# Patient Record
Sex: Female | Born: 1965 | Hispanic: No | Marital: Married | Smoking: Never smoker
Health system: Southern US, Community
[De-identification: ages and names within clinical notes are randomized; demographics above are authoritative.]

## PROBLEM LIST (undated history)

## (undated) HISTORY — PX: OTHER SURGICAL HISTORY: SHX169

---

## 2017-06-13 ENCOUNTER — Emergency Department (HOSPITAL_COMMUNITY)
Admission: EM | Admit: 2017-06-13 | Discharge: 2017-06-13 | Disposition: A | Discharge status: Home / Self Care | Payer: Self-pay | Attending: Emergency Medicine | Admitting: Emergency Medicine

## 2017-06-13 ENCOUNTER — Encounter (HOSPITAL_COMMUNITY): Payer: Self-pay | Admitting: Emergency Medicine

## 2017-06-13 ENCOUNTER — Emergency Department (HOSPITAL_COMMUNITY): Payer: Self-pay

## 2017-06-13 DIAGNOSIS — H5713 Ocular pain, bilateral: Secondary | ICD-10-CM | POA: Insufficient documentation

## 2017-06-13 DIAGNOSIS — Z79899 Other long term (current) drug therapy: Secondary | ICD-10-CM | POA: Insufficient documentation

## 2017-06-13 DIAGNOSIS — R51 Headache: Secondary | ICD-10-CM | POA: Insufficient documentation

## 2017-06-13 DIAGNOSIS — H539 Unspecified visual disturbance: Secondary | ICD-10-CM | POA: Insufficient documentation

## 2017-06-13 LAB — COMPREHENSIVE METABOLIC PANEL
ALT: 25 U/L (ref 14–54)
ANION GAP: 9 (ref 5–15)
AST: 23 U/L (ref 15–41)
Albumin: 4.3 g/dL (ref 3.5–5.0)
Alkaline Phosphatase: 76 U/L (ref 38–126)
BILIRUBIN TOTAL: 0.8 mg/dL (ref 0.3–1.2)
BUN: 11 mg/dL (ref 6–20)
CO2: 26 mmol/L (ref 22–32)
Calcium: 9 mg/dL (ref 8.9–10.3)
Chloride: 104 mmol/L (ref 101–111)
Creatinine, Ser: 0.83 mg/dL (ref 0.44–1.00)
Glucose, Bld: 81 mg/dL (ref 65–99)
POTASSIUM: 3.7 mmol/L (ref 3.5–5.1)
Sodium: 139 mmol/L (ref 135–145)
TOTAL PROTEIN: 7.5 g/dL (ref 6.5–8.1)

## 2017-06-13 LAB — CBC WITH DIFFERENTIAL/PLATELET
BASOS ABS: 0 10*3/uL (ref 0.0–0.1)
Basophils Relative: 0 %
EOS PCT: 0 %
Eosinophils Absolute: 0 10*3/uL (ref 0.0–0.7)
HCT: 40.4 % (ref 36.0–46.0)
Hemoglobin: 13.3 g/dL (ref 12.0–15.0)
LYMPHS PCT: 36 %
Lymphs Abs: 2.9 10*3/uL (ref 0.7–4.0)
MCH: 30.4 pg (ref 26.0–34.0)
MCHC: 32.9 g/dL (ref 30.0–36.0)
MCV: 92.4 fL (ref 78.0–100.0)
Monocytes Absolute: 0.6 10*3/uL (ref 0.1–1.0)
Monocytes Relative: 7 %
NEUTROS ABS: 4.5 10*3/uL (ref 1.7–7.7)
Neutrophils Relative %: 57 %
PLATELETS: 218 10*3/uL (ref 150–400)
RBC: 4.37 MIL/uL (ref 3.87–5.11)
RDW: 13.1 % (ref 11.5–15.5)
WBC: 8 10*3/uL (ref 4.0–10.5)

## 2017-06-13 MED ORDER — TETRACAINE HCL 0.5 % OP SOLN
2.0000 [drp] | Freq: Once | OPHTHALMIC | Status: AC
  Administered 2017-06-13: 2 [drp] via OPHTHALMIC
  Filled 2017-06-13: qty 4

## 2017-06-13 MED ORDER — GADOBENATE DIMEGLUMINE 529 MG/ML IV SOLN
20.0000 mL | Freq: Once | INTRAVENOUS | Status: AC | PRN
  Administered 2017-06-13: 20 mL via INTRAVENOUS

## 2017-06-13 NOTE — ED Provider Notes (Signed)
[facility]-EMERGENCY DEPT Provider Note   CSN: [id number] Arrival date & time: 06/13/17  1214     History   Chief Complaint Chief Complaint  Patient presents with  . needs CT scan    HPI Stepheny Mcgroarty is a 51 y.o. female.  HPI   Marlowe SaxFelichia Kopinski is a 51 y.o. female, patient with no pertinent past medical history, presenting to the ED with intermittent eye pain for the past two weeks.  Progression of symptoms: Began to have a headache on right right frontal region accompanied by nausea about two weeks ago. After 2 days she began to have a foreign body sensation in the right eye with a visual disturbance, "like I could see something floating in my vision."  Seen by Herschel SenegalZachary Nobles, OD on 05/28/17. He reportedly found no FB. Was prescribed 7 days of Tobramycin/Dexamethasone (0.3-0.1%, 1 drop QID in the right eye).   Two days ago began to have severe bilateral eye pain, puffiness around her eyes, with teary drainage and severe photophobia. Eyes and surrounding area were sensitive to touch. Pain with EOMs, no pain without eye movement. Saw Dr. Mack HookAndrew Mincey that same day (9/11).  Information from visit summary from Dr. 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 Dr. Genia DelMincey prescribed prednisolone 1% (1 drop in both eyes four times a day) and told her to go to the ED for MRI brain and orbits c/s contrast. She has not started the prednisolone. She has follow up appt on Friday, Sept 14 at 8:30 AM. States earlier today she felt some pressure around her eyes bilaterally as well as some "fogginess" to her vision. No complaints currently. She has not worn her contacts since her complaints began.  Denies dizziness, nausea/vomiting, eye discharge, or any other complaints.   History reviewed. No pertinent past medical history.  There are no active problems to display for this patient.   Past Surgical History:  Procedure Laterality Date  . cyst removed     off  back     OB History    No data available       Home Medications    Prior to Admission medications   Medication Sig Start Date End Date Taking? Authorizing Provider  Multiple Vitamin (MULTIVITAMIN WITH MINERALS) TABS tablet Take 1 tablet by mouth once a week.   Yes [provider]  prednisoLONE acetate (PRED FORTE) 1 % ophthalmic suspension Place 1 drop into both eyes 4 (four) times daily. 06/11/17   [provider]    Family History No family history on file.  Social History Social History  Substance Use Topics  . Smoking status: Never Smoker  . Smokeless tobacco: Never Used  . Alcohol use Not on file     Allergies   Patient has no known allergies.   Review of Systems Review of Systems  Constitutional: Negative for chills, diaphoresis and fever.  Eyes: Positive for pain and visual disturbance.  Respiratory: Negative for shortness of breath.   Cardiovascular: Negative for chest pain.  Gastrointestinal: Negative for nausea and vomiting.  Musculoskeletal: Negative for back pain and neck pain.  Neurological: Negative for dizziness, syncope, weakness, light-headedness, numbness and headaches.  All other systems reviewed and are negative.    Physical Exam Updated Vital Signs BP 106/76   Pulse 82   Temp 98.7 F (37.1 C) (Oral)   Resp 16   Ht 5\' 4"  (1.626 m)   Wt 115.5 kg (254 lb 9 oz)  SpO2 100%   BMI 43.70 kg/m   Physical Exam  Constitutional: She appears well-developed and well-nourished. No distress.  HENT:  Head: Normocephalic and atraumatic.  Eyes: Pupils are equal, round, and reactive to light. Conjunctivae and EOM are normal.  No pain with EOMs. No scleral or conjunctival injection. No noted discharge. No contact lenses in place. No periorbital edema.  Tono-Pen values: Right eye: 18  Left eye: 17    Visual Acuity  Right Eye Distance: 20/60 Left Eye Distance: 20/40 Bilateral Distance: 20/40  Right Eye Near:   Left Eye Near:     Bilateral Near:  20/25 (with glasses)   Neck: Neck supple.  Cardiovascular: Normal rate, regular rhythm and intact distal pulses.   Pulmonary/Chest: Effort normal. No respiratory distress.  Abdominal: There is no guarding.  Musculoskeletal: She exhibits no edema.  Lymphadenopathy:    She has no cervical adenopathy.  Neurological: She is alert.  Skin: Skin is warm and dry. She is not diaphoretic.  Psychiatric: She has a normal mood and affect. Her behavior is normal.  Nursing note and vitals reviewed.    ED Treatments / Results  Labs (all labs ordered are listed, but only abnormal results are displayed) Labs Reviewed  COMPREHENSIVE METABOLIC PANEL  CBC WITH DIFFERENTIAL/PLATELET    EKG  EKG Interpretation None       Radiology Mr Laqueta Jean And Wo Contrast  Result Date: 06/13/2017 CLINICAL DATA:  Initial evaluation for acute visual loss EXAM: MRI HEAD AND ORBITS WITHOUT AND WITH CONTRAST TECHNIQUE: Multiplanar, multiecho pulse sequences of the brain and surrounding structures were obtained without and with intravenous contrast. Multiplanar, multiecho pulse sequences of the orbits and surrounding structures were obtained including fat saturation techniques, before and after intravenous contrast administration. CONTRAST:  20mL MULTIHANCE GADOBENATE DIMEGLUMINE 529 MG/ML IV SOLN COMPARISON:  None. FINDINGS: MRI HEAD FINDINGS Brain: Cerebral volume within normal limits. No focal parenchymal signal abnormality. No evidence for acute or subacute infarct. No evidence for chronic infarction. No acute or chronic intracranial hemorrhage. No mass lesion, midline shift or mass effect. No hydrocephalus. No extra-axial fluid collection. Major dural sinuses are patent. Pituitary gland suprasellar region within normal limits. Vascular: Major intracranial vascular flow voids maintained. Skull and upper cervical spine: Craniocervical junction normal. Upper cervical spine within normal limits. Bone  marrow signal intensity normal. No scalp soft tissue abnormality. Other: Inner ear structures normal.  No mastoid effusion. MRI ORBITS FINDINGS Orbits: Globes normal in appearance with symmetric size and morphology bilaterally. Optic nerves symmetric and normal in appearance bilaterally without abnormal enhancement or other abnormality. No appreciable bulging of the optic discs. No abnormality seen at the orbital apices. Optic chiasm normally position within the suprasellar cistern and is normal in appearance. Intraconal and extraconal fat well-preserved. Superior orbital veins normal. Extra-ocular muscles symmetric and within normal limits bilaterally. Lacrimal glands normal. No appreciable periorbital soft tissue swelling. Visualized sinuses: Mild scattered mucosal thickening within the anterior ethmoidal air cells. Otherwise clear. Soft tissues: Unremarkable. IMPRESSION: Normal MRI of the brain and orbits. No acute abnormality identified. Electronically Signed   By: Rise Mu M.D.   On: 06/13/2017 20:08   Mr Rockwell [place] [employer] Contrast  Result Date: 06/13/2017 CLINICAL DATA:  Initial evaluation for acute visual loss EXAM: MRI HEAD AND ORBITS WITHOUT AND WITH CONTRAST TECHNIQUE: Multiplanar, multiecho pulse sequences of the brain and surrounding structures were obtained without and with intravenous contrast. Multiplanar, multiecho pulse sequences of the orbits and surrounding structures were obtained including fat  saturation techniques, before and after intravenous contrast administration. CONTRAST:  20mL MULTIHANCE GADOBENATE DIMEGLUMINE 529 MG/ML IV SOLN COMPARISON:  None. FINDINGS: MRI HEAD FINDINGS Brain: Cerebral volume within normal limits. No focal parenchymal signal abnormality. No evidence for acute or subacute infarct. No evidence for chronic infarction. No acute or chronic intracranial hemorrhage. No mass lesion, midline shift or mass effect. No hydrocephalus. No extra-axial fluid collection.  Major dural sinuses are patent. Pituitary gland suprasellar region within normal limits. Vascular: Major intracranial vascular flow voids maintained. Skull and upper cervical spine: Craniocervical junction normal. Upper cervical spine within normal limits. Bone marrow signal intensity normal. No scalp soft tissue abnormality. Other: Inner ear structures normal.  No mastoid effusion. MRI ORBITS FINDINGS Orbits: Globes normal in appearance with symmetric size and morphology bilaterally. Optic nerves symmetric and normal in appearance bilaterally without abnormal enhancement or other abnormality. No appreciable bulging of the optic discs. No abnormality seen at the orbital apices. Optic chiasm normally position within the suprasellar cistern and is normal in appearance. Intraconal and extraconal fat well-preserved. Superior orbital veins normal. Extra-ocular muscles symmetric and within normal limits bilaterally. Lacrimal glands normal. No appreciable periorbital soft tissue swelling. Visualized sinuses: Mild scattered mucosal thickening within the anterior ethmoidal air cells. Otherwise clear. Soft tissues: Unremarkable. IMPRESSION: Normal MRI of the brain and orbits. No acute abnormality identified. Electronically Signed   By: Rise Mu M.D.   On: 06/13/2017 20:08    Procedures Procedures (including critical care time)  Medications Ordered in ED Medications  tetracaine (PONTOCAINE) 0.5 % ophthalmic solution 2 drop (2 drops Both Eyes Given by Other 06/13/17 1743)  gadobenate dimeglumine (MULTIHANCE) injection 20 mL (20 mLs Intravenous Contrast Given 06/13/17 1913)     Initial Impression / Assessment and Plan / ED Course  I have reviewed the triage vital signs and the nursing notes.  Pertinent labs & imaging results that were available during my care of the patient were reviewed by me and considered in my medical decision making (see chart for details).     Patient presents with previous  bilateral eye pain. Ophthalmologist noted bilateral optic disc edema. MRI free from acute abnormalities. Patient pain-free at discharge. Prednisolone drops administered prior to discharge. Patient has close follow-up with ophthalmology tomorrow morning. Return precautions discussed.  Final Clinical Impressions(s) / ED Diagnoses   Final diagnoses:  Pain of both eyes    New Prescriptions Discharge Medication List as of 06/13/2017  8:54 PM       Anselm Pancoast, PA-C 06/13/17 2126    Linwood Dibbles, MD 06/15/17 [phone]

## 2017-06-13 NOTE — ED Notes (Signed)
Patient transported to MRI 

## 2017-06-13 NOTE — Discharge Instructions (Signed)
There were no acute abnormalities noted on the MRI. Please take the eyedrops, as prescribed by Dr. Genia DelMincey. Follow-up with Dr. Genia DelMincey, as planned, tomorrow morning. Return to the ED as needed for worsening symptoms.

## 2017-06-13 NOTE — ED Triage Notes (Signed)
Patient c/o left groin pain that has been intermittent over past year that is worse with movement.  Patient denies any issues with urination.   Patient c/o anterior headache with blood shot red eyes and clear drainage that started about week ago. patient was seen at eye doctor for it that saw nothing in eyes and took drops that was given for couple days.  patient thought ws having reaction to eye drops so went and saw another eye specialist, who was concerned for swollen nerves in posterior eyes and recommended patient get a ct scan.  Patient reports that she started having flu like symptoms: body aches, nausea, fatigue and headache that started 2 weeks ago that only lasted 2 days then came back yesterday but then went away.  When asking patient what she is wanting ED to be able to do for her today: she states that she wants to have CT scan to figure out what is causing nerves to be swollen and so she can follow back up with eye doctor.

## 2018-09-21 IMAGING — MR MR ORBITS WO/W CM
11 of 18 series · 31 of 48 positions shown · IV contrast (multihance)
Comparison: None.

CLINICAL DATA: Initial evaluation for acute visual loss

EXAM:
MRI HEAD AND ORBITS WITHOUT AND WITH CONTRAST
TECHNIQUE: Multiplanar, multiecho pulse sequences of the brain and surrounding
structures were obtained without and with intravenous contrast.
Multiplanar, multiecho pulse sequences of the orbits and surrounding
structures were obtained including fat saturation techniques, before
and after intravenous contrast administration.
CONTRAST:  20mL MULTIHANCE GADOBENATE DIMEGLUMINE 529 MG/ML IV SOLN

[Series 3: DWI · axial · 3.0mm · 1.09mm/px · z∈[-89,+48]mm · 8 of 96 slices shown (1 of 4)]
[im 1/96]
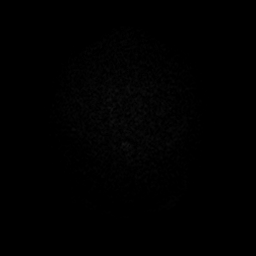
[im 14/96]
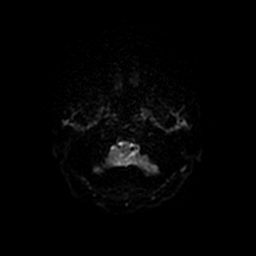
[im 28/96]
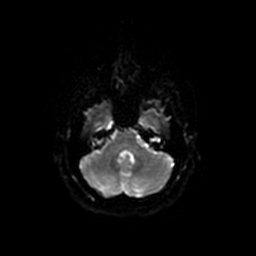
[im 41/96]
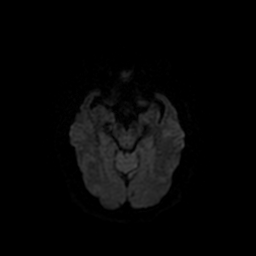
[im 55/96]
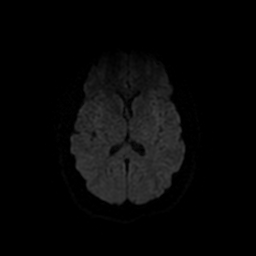
[im 68/96]
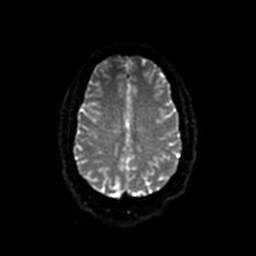
[im 82/96]
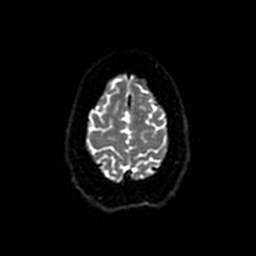
[im 96/96]
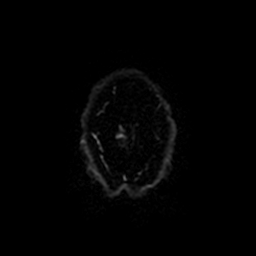

[Series 5: DWI · coronal · 5.0mm · 1.09mm/px · 5 of 62 slices shown (2 of 4)]
[im 1/62]
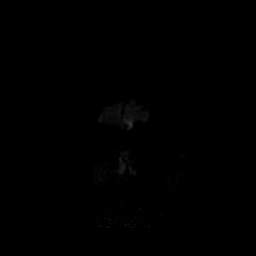
[im 16/62]
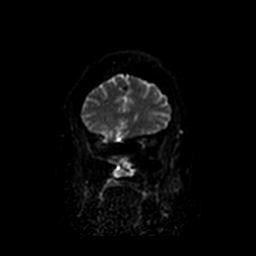
[im 31/62]
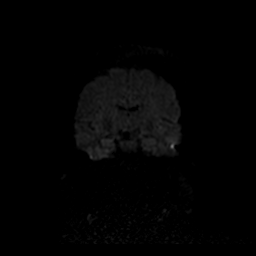
[im 46/62]
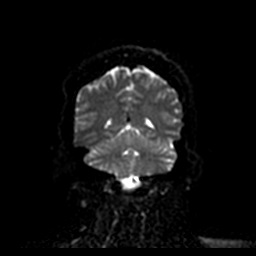
[im 62/62]
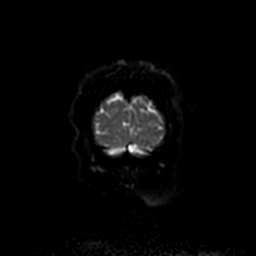

[Series 6: T2 · axial · 5.0mm · 0.43mm/px · z∈[-101,+54]mm · 2 of 24 slices shown]
[im 1/24]
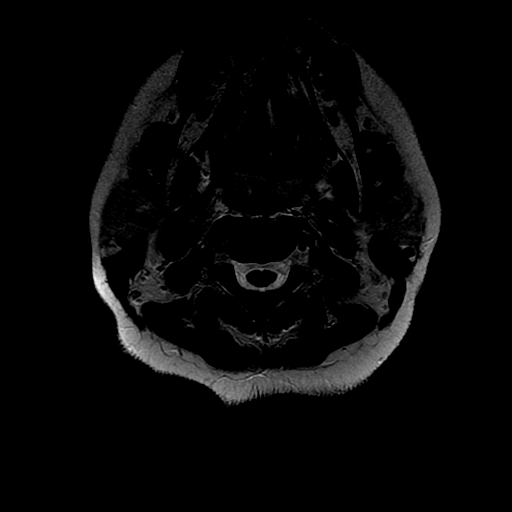
[im 24/24]
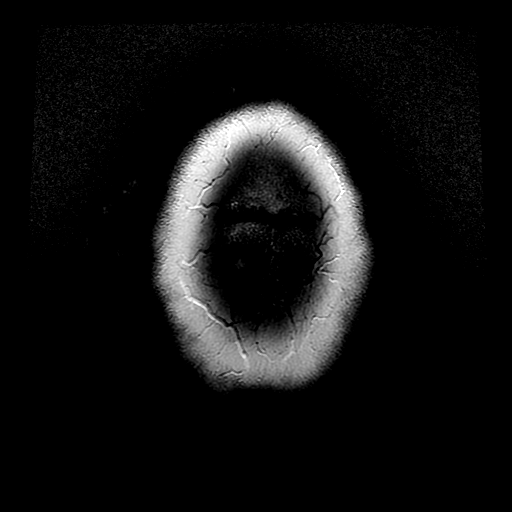

[Series 7: FLAIR · axial · 3.0mm · 0.43mm/px · z∈[-88,+50]mm · 2 of 25 slices shown]
[im 1/25]
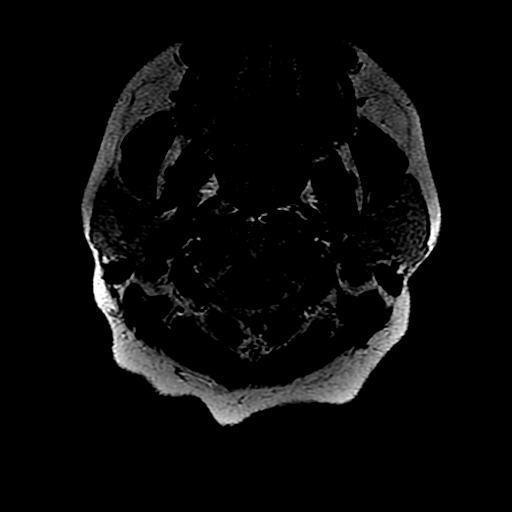
[im 25/25]
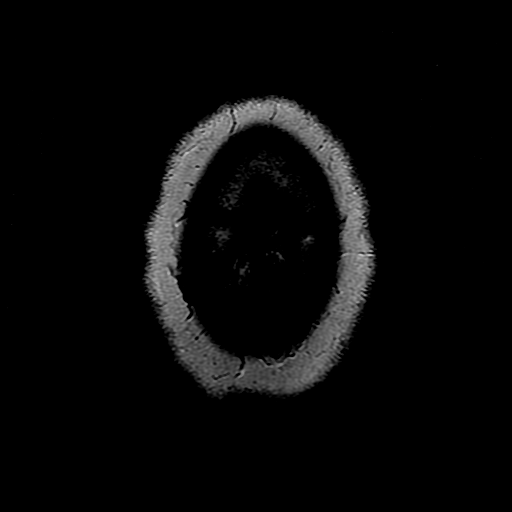

[Series 10: T2 fat-sat · axial · 3.0mm · 0.35mm/px · 1 of 18 slices shown (1 of 2)]
[im 1/18]
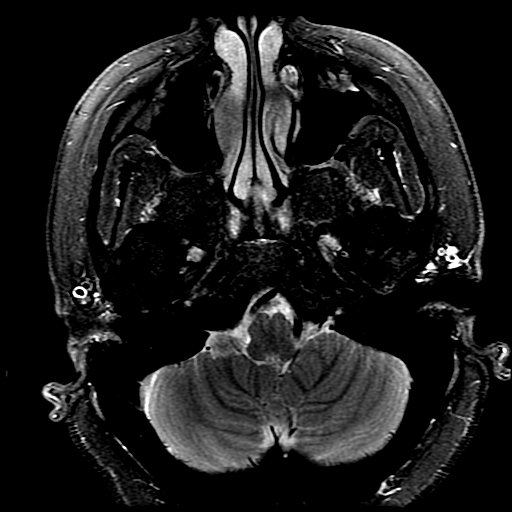

[Series 11: T2 fat-sat · coronal · 3.0mm · 0.35mm/px · 2 of 28 slices shown (2 of 2)]
[im 1/28]
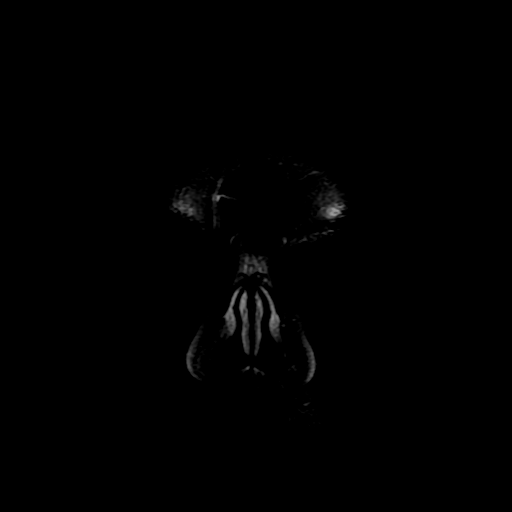
[im 28/28]
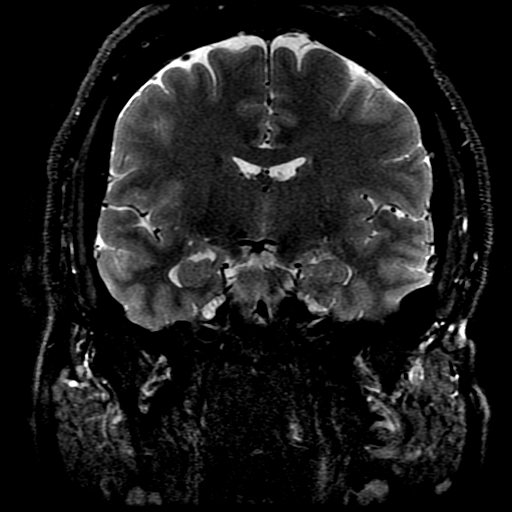

[Series 16: T1 post-contrast · coronal · 5.0mm · 0.47mm/px · 2 of 24 slices shown (1 of 3)]
[im 1/24]
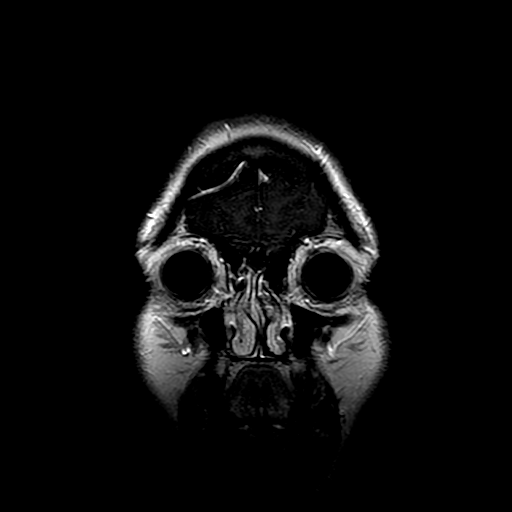
[im 24/24]
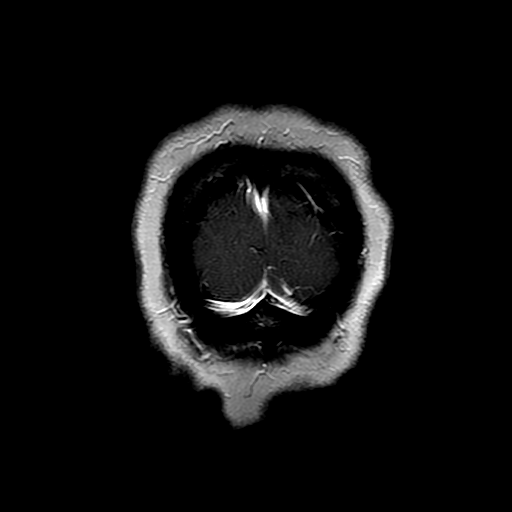

[Series 17: T1 post-contrast · axial · 3.0mm · 0.35mm/px · 1 of 18 slices shown (2 of 3)]
[im 1/18]
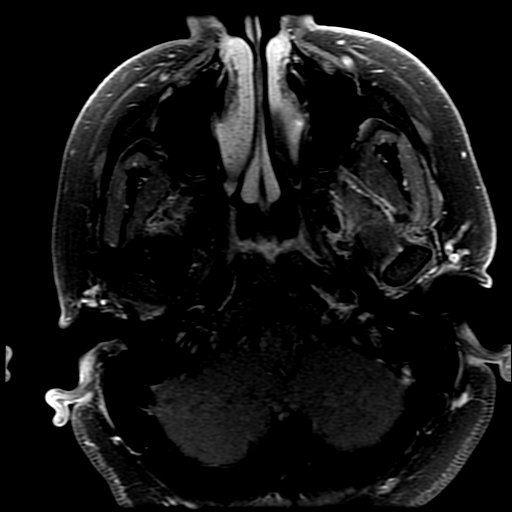

[Series 18: T1 post-contrast · coronal · 3.0mm · 0.35mm/px · 2 of 28 slices shown (3 of 3)]
[im 1/28]
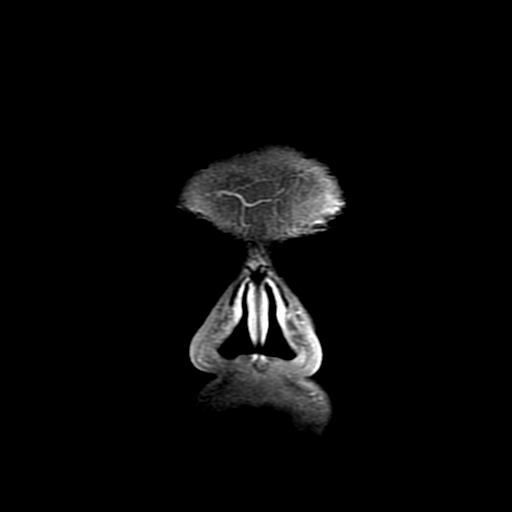
[im 28/28]
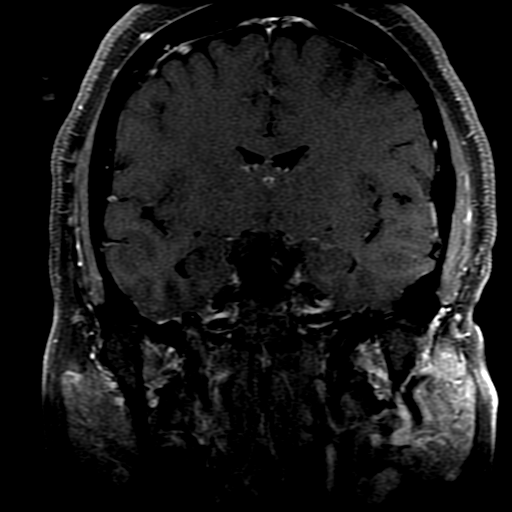

[Series 300: DWI · axial · 3.0mm · 1.09mm/px · z∈[-89,+48]mm · 4 of 48 slices shown (3 of 4)]
[im 1/48]
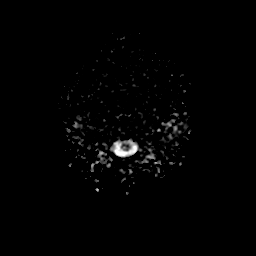
[im 16/48]
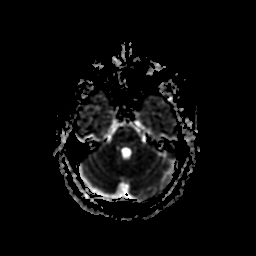
[im 32/48]
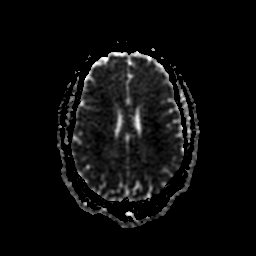
[im 48/48]
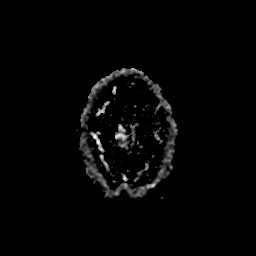

[Series 500: DWI · coronal · 5.0mm · 1.09mm/px · 2 of 31 slices shown (4 of 4)]
[im 1/31]
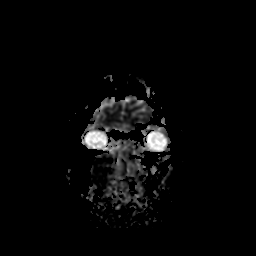
[im 31/31]
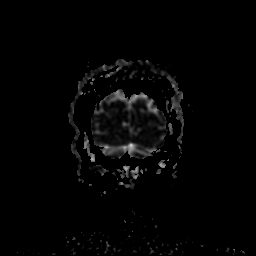

[31 of 48 positions shown; findings below may reference images not displayed]

FINDINGS: MRI HEAD FINDINGS

Brain: Cerebral volume within normal limits. No focal parenchymal
signal abnormality. No evidence for acute or subacute infarct. No
evidence for chronic infarction. No acute or chronic intracranial
hemorrhage.

No mass lesion, midline shift or mass effect. No hydrocephalus. No
extra-axial fluid collection. Major dural sinuses are patent.

Pituitary gland suprasellar region within normal limits.

Vascular: Major intracranial vascular flow voids maintained.

Skull and upper cervical spine: Craniocervical junction normal.
Upper cervical spine within normal limits. Bone marrow signal
intensity normal. No scalp soft tissue abnormality.

Other: Inner ear structures normal.  No mastoid effusion.

MRI ORBITS FINDINGS

Orbits: Globes normal in appearance with symmetric size and
morphology bilaterally. Optic nerves symmetric and normal in
appearance bilaterally without abnormal enhancement or other
abnormality. No appreciable bulging of the optic discs. No
abnormality seen at the orbital apices. Optic chiasm normally
position within the suprasellar cistern and is normal in appearance.
Intraconal and extraconal fat well-preserved. Superior orbital veins
normal. Extra-ocular muscles symmetric and within normal limits
bilaterally. Lacrimal glands normal. No appreciable periorbital soft
tissue swelling.

Visualized sinuses: Mild scattered mucosal thickening within the
anterior ethmoidal air cells. Otherwise clear.

Soft tissues: Unremarkable.
IMPRESSION: Normal MRI of the brain and orbits. No acute abnormality identified.
